# Patient Record
Sex: Male | Born: 1980 | Race: White | Hispanic: No | Marital: Single | Smoking: Current every day smoker
Health system: Southern US, Community
[De-identification: ages and names within clinical notes are randomized; demographics above are authoritative.]

## PROBLEM LIST (undated history)

## (undated) DIAGNOSIS — F988 Other specified behavioral and emotional disorders with onset usually occurring in childhood and adolescence: Secondary | ICD-10-CM

## (undated) DIAGNOSIS — F32A Depression, unspecified: Secondary | ICD-10-CM

## (undated) DIAGNOSIS — F329 Major depressive disorder, single episode, unspecified: Secondary | ICD-10-CM

---

## 2015-11-07 ENCOUNTER — Emergency Department
Admission: EM | Admit: 2015-11-07 | Discharge: 2015-11-07 | Disposition: A | Discharge status: Home / Self Care | Payer: BLUE CROSS/BLUE SHIELD | Source: Home / Self Care | Attending: Family Medicine | Admitting: Family Medicine

## 2015-11-07 ENCOUNTER — Emergency Department (INDEPENDENT_AMBULATORY_CARE_PROVIDER_SITE_OTHER): Payer: BLUE CROSS/BLUE SHIELD

## 2015-11-07 ENCOUNTER — Encounter: Payer: Self-pay | Admitting: *Deleted

## 2015-11-07 DIAGNOSIS — R0981 Nasal congestion: Secondary | ICD-10-CM | POA: Diagnosis not present

## 2015-11-07 DIAGNOSIS — R51 Headache: Secondary | ICD-10-CM | POA: Diagnosis not present

## 2015-11-07 DIAGNOSIS — L03211 Cellulitis of face: Secondary | ICD-10-CM

## 2015-11-07 HISTORY — DX: Major depressive disorder, single episode, unspecified: F32.9

## 2015-11-07 HISTORY — DX: Other specified behavioral and emotional disorders with onset usually occurring in childhood and adolescence: F98.8

## 2015-11-07 HISTORY — DX: Depression, unspecified: F32.A

## 2015-11-07 MED ORDER — DOXYCYCLINE HYCLATE 100 MG PO CAPS: 100.0000 mg | ORAL_CAPSULE | Freq: Two times a day (BID) | ORAL | 0 refills | Status: DC

## 2015-11-07 NOTE — ED Triage Notes (Signed)
Pt c/o 3 weeks of dry cough. Today he noted his forehead swelling, redness and warm to touch.

## 2015-11-07 NOTE — ED Provider Notes (Signed)
[facility]    CSN: [id number] Arrival date & time: 11/07/15  1328     History   Chief Complaint Chief Complaint  Patient presents with  . Cough  . Forehead Swelling    HPI Lemont FillersJason Stuart is a 35 y.o. male.   Patient reports that he has had a non-productive cough for about 3 weeks with fatigue and nasal congestion, all now improved.   Today he noticed that his forehead felt hot with pressure-like discomfort, and appeared flushed.  He continues to have mild nasal congestion.  He has a history of seasonal rhinitis.  No known contact with allergens. He states that his father, mother, paternal aunt, and sister all have sinus problems.   The history is provided by the patient.    Past Medical History:  Diagnosis Date  . ADD (attention deficit disorder)   . Depression     There are no active problems to display for this patient.   History reviewed. No pertinent surgical history.     Home Medications    Prior to Admission medications   Medication Sig Start Date End Date Taking? Authorizing Provider  amphetamine-dextroamphetamine (ADDERALL) 30 MG tablet Take 30 mg by mouth daily.   Yes Historical Provider, MD  clonazePAM (KLONOPIN) 1 MG tablet Take 1 mg by mouth 2 (two) times daily.   Yes Historical Provider, MD  DULoxetine (CYMBALTA) 30 MG capsule Take 30 mg by mouth daily.   Yes Historical Provider, MD  gabapentin (NEURONTIN) 300 MG capsule Take 300 mg by mouth 3 (three) times daily.   Yes Historical Provider, MD  QUEtiapine (SEROQUEL) 300 MG tablet Take 300 mg by mouth at bedtime.   Yes Historical Provider, MD  topiramate (TOPAMAX) 200 MG tablet Take 200 mg by mouth 2 (two) times daily.   Yes Historical Provider, MD  zolpidem (AMBIEN) 10 MG tablet Take 10 mg by mouth at bedtime as needed for sleep.   Yes Historical Provider, MD  doxycycline (VIBRAMYCIN) 100 MG capsule Take 1 capsule (100 mg total) by mouth 2 (two) times daily. Take with food. 11/07/15    Lattie HawStephen A Ogden Handlin, MD    Family History Family History  Problem Relation Age of Onset  . Hypertension Father     Social History Social History  Substance Use Topics  . Smoking status: Current Every Day Smoker    Types: E-cigarettes  . Smokeless tobacco: Never Used  . Alcohol use Yes     Allergies   Sulfur   Review of Systems Review of Systems  No sore throat + cough, improving No pleuritic pain No wheezing + nasal congestion + post-nasal drainage No sinus pain/pressure No itchy/red eyes No earache No hemoptysis No SOB No fever, but feels hot No nausea No vomiting No abdominal pain No diarrhea No urinary symptoms + skin rash face + fatigue No myalgias + headache Used OTC meds without relief    Physical Exam Triage Vital Signs ED Triage Vitals  Enc Vitals Group     BP 11/07/15 1401 139/91     Pulse Rate 11/07/15 1401 77     Resp --      Temp 11/07/15 1401 98.3 F (36.8 C)     Temp Source 11/07/15 1401 Oral     SpO2 11/07/15 1401 98 %     Weight 11/07/15 1401 171 lb (77.6 kg)     Height --      Head Circumference --      Peak Flow --  Pain Score 11/07/15 1405 0     Pain Loc --      Pain Edu? --      Excl. in GC? --    No data found.   Updated Vital Signs BP 139/91 (BP Location: Left Arm)   Pulse 77   Temp 98.3 F (36.8 C) (Oral)   Wt 171 lb (77.6 kg)   SpO2 98%   Visual Acuity Right Eye Distance:   Left Eye Distance:   Bilateral Distance:    Right Eye Near:   Left Eye Near:    Bilateral Near:     Physical Exam  Constitutional: He appears well-developed and well-nourished. No distress.  HENT:  Head: Normocephalic.    Right Ear: Tympanic membrane, external ear and ear canal normal.  Left Ear: Tympanic membrane, external ear and ear canal normal.  Nose: Nose normal.  Mouth/Throat: Oropharynx is clear and moist.  Forehead is slightly erythematous and warm, without tenderness to palpation, induration, or fluctuance.  Eyes:  Conjunctivae and EOM are normal. Pupils are equal, round, and reactive to light.  Neck: Neck supple.  Posterior/lateral cervical nodes are tender and slightly enlarged.  Cardiovascular: Normal heart sounds.   Pulmonary/Chest: Breath sounds normal.  Abdominal: Bowel sounds are normal. There is no tenderness.  Musculoskeletal: He exhibits no edema.  Neurological: He is alert.  Skin: Skin is warm. There is erythema.  Nursing note and vitals reviewed.    UC Treatments / Results  Labs (all labs ordered are listed, but only abnormal results are displayed) Labs Reviewed - No data to display  EKG  EKG Interpretation None       Radiology Dg Sinuses Complete  Result Date: 11/07/2015 CLINICAL DATA:  Frontal sinus pain, facial pain EXAM: PARANASAL SINUSES - COMPLETE 3 + VIEW COMPARISON:  None. FINDINGS: The paranasal sinus are aerated. There is no evidence of sinus opacification air-fluid levels or mucosal thickening. No significant bone abnormalities are seen. IMPRESSION: Negative. Electronically Signed   By: [name] M.D.   On: 11/07/2015 14:44    Procedures Procedures (including critical care time)  Medications Ordered in UC Medications - No data to display   Initial Impression / Assessment and Plan / UC Course  I have reviewed the triage vital signs and the nursing notes.  Pertinent labs & imaging results that were available during my care of the patient were reviewed by me and considered in my medical decision making (see chart for details).  Clinical Course  No evidence sinusitis. ?cellulitis forehead, ?etiology Begin empiric doxycycline 100mg  BID for staph coverage.   Followup with dermatologist if not improved one week.     Final Clinical Impressions(s) / UC Diagnoses   Final diagnoses:  Cellulitis of forehead    New Prescriptions New Prescriptions   DOXYCYCLINE (VIBRAMYCIN) 100 MG CAPSULE    Take 1 capsule (100 mg total) by mouth 2 (two) times daily. Take  with food.     Lattie Haw, MD 11/13/15 [phone]

## 2016-01-12 ENCOUNTER — Encounter: Payer: Self-pay | Admitting: *Deleted

## 2016-01-12 ENCOUNTER — Emergency Department (INDEPENDENT_AMBULATORY_CARE_PROVIDER_SITE_OTHER)
Admission: EM | Admit: 2016-01-12 | Discharge: 2016-01-12 | Disposition: A | Discharge status: Home / Self Care | Payer: BLUE CROSS/BLUE SHIELD | Source: Home / Self Care | Attending: Family Medicine | Admitting: Family Medicine

## 2016-01-12 DIAGNOSIS — S29012A Strain of muscle and tendon of back wall of thorax, initial encounter: Secondary | ICD-10-CM

## 2016-01-12 MED ORDER — METAXALONE 800 MG PO TABS: ORAL_TABLET | ORAL | 0 refills | Status: AC

## 2016-01-12 NOTE — Discharge Instructions (Addendum)
Apply ice pack for 20 to 30 minutes, 3 to 4 times daily  Continue until pain and swelling decrease.  May take Ibuprofen 200mg, 4 tabs every 8 hours with food.  Begin range of motion and stretching exercises as tolerated. °
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
## 2017-04-20 IMAGING — DX DG SINUSES COMPLETE 3+V
3 series · 3 of 3 positions shown · non-contrast
Comparison: None.

CLINICAL DATA: Frontal sinus pain, facial pain

EXAM:
PARANASAL SINUSES - COMPLETE 3 + VIEW

[pns waters]
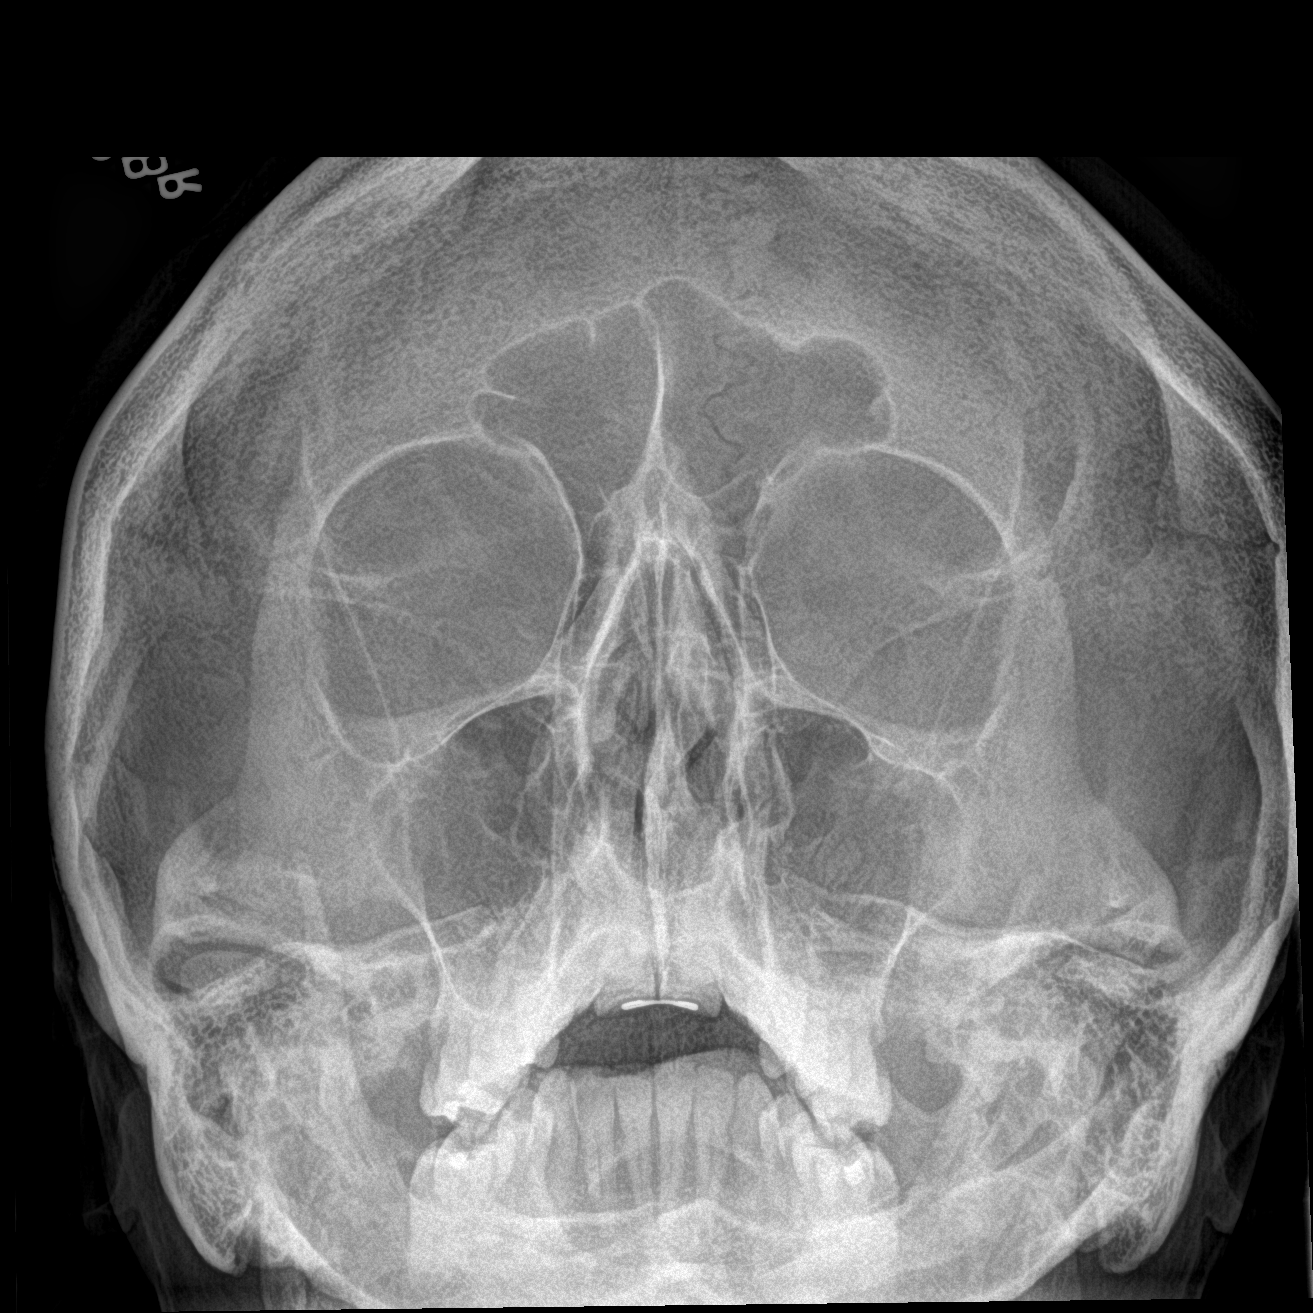

[[person_name]]
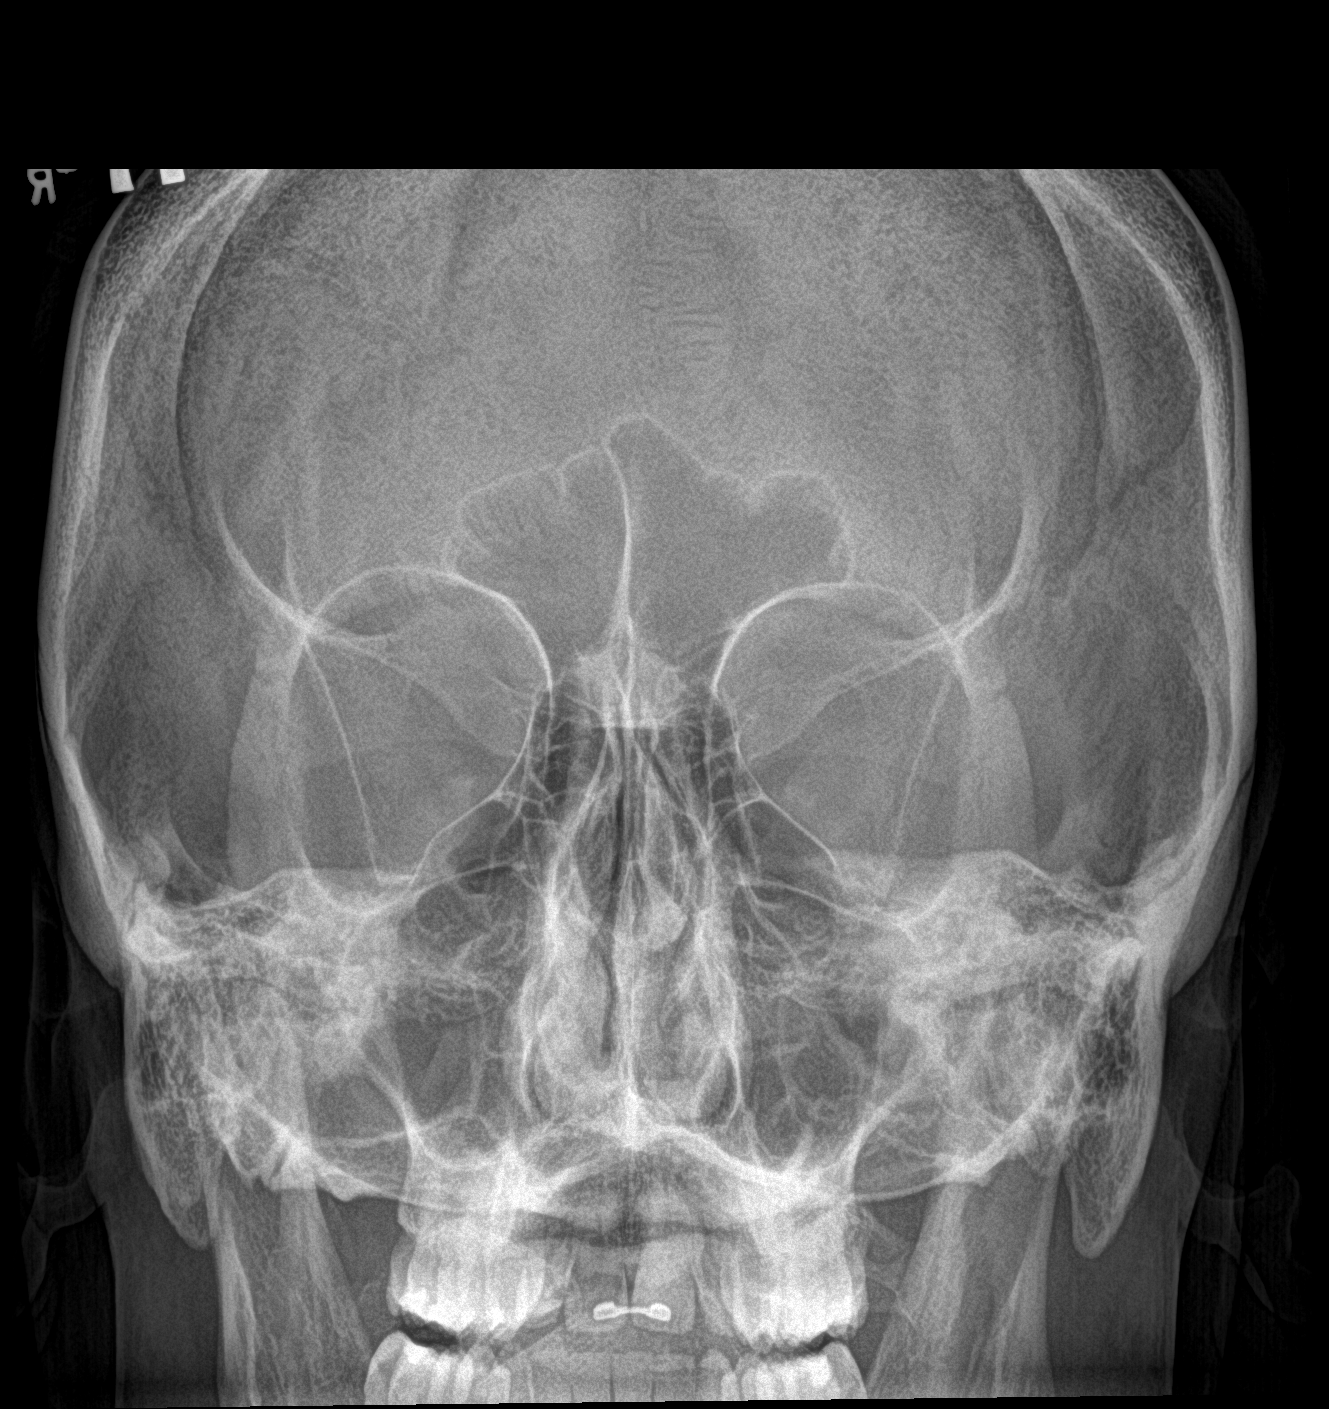

[pns lat]
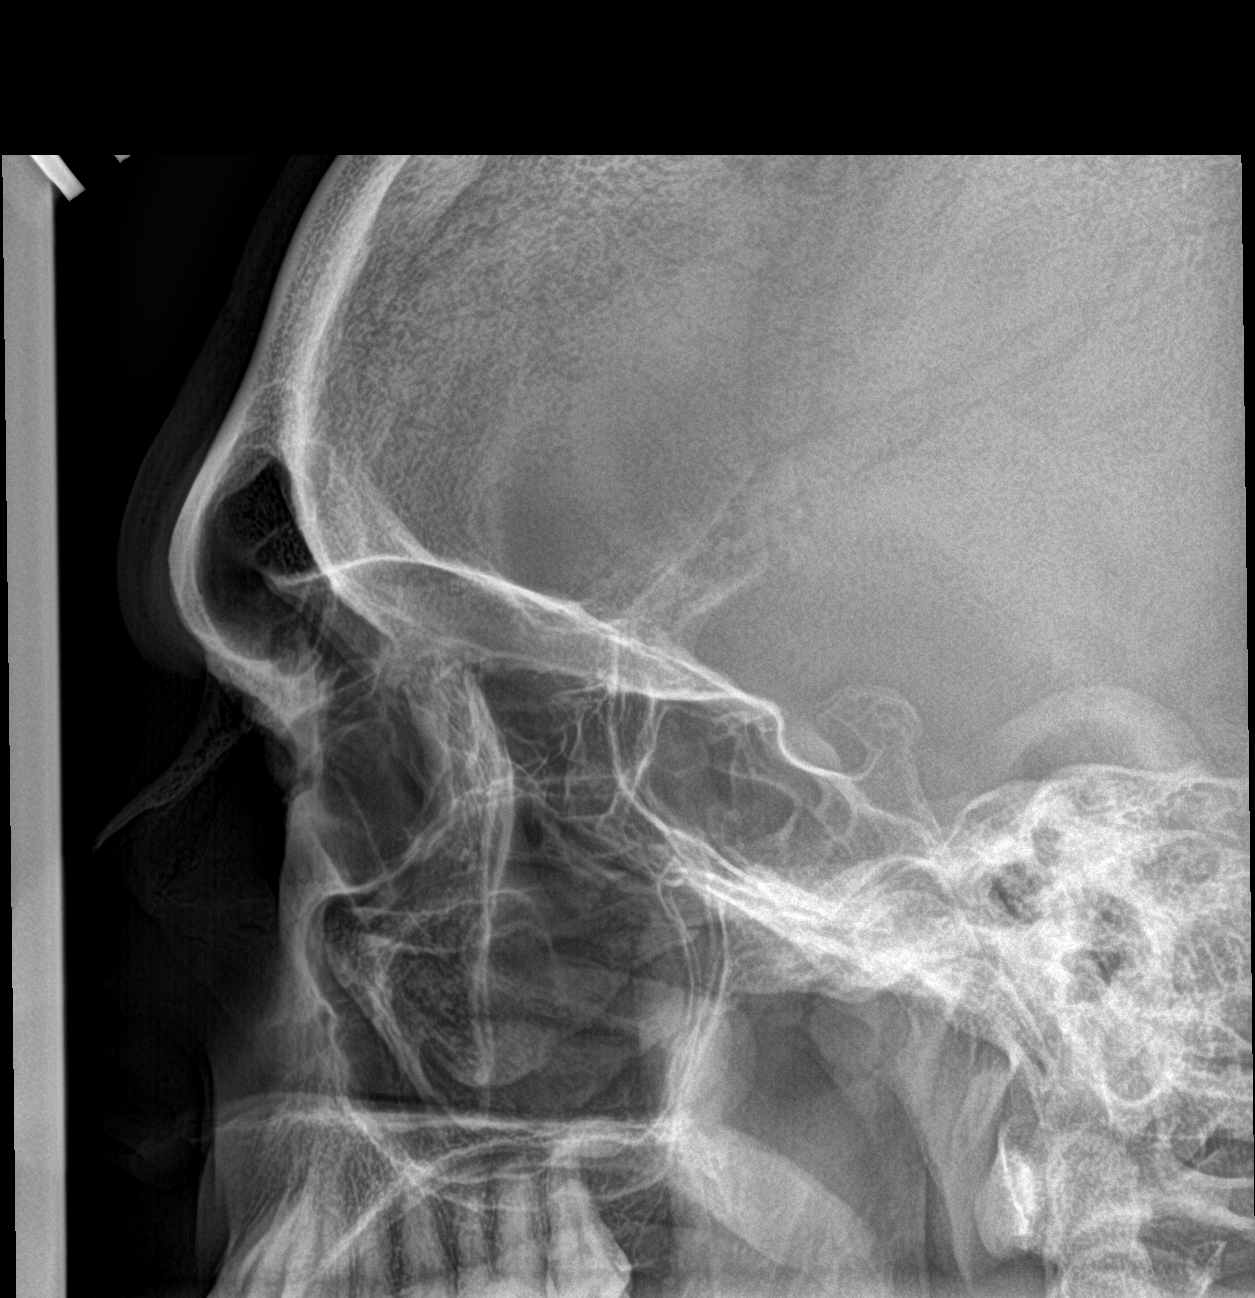

[3 of 3 positions shown; findings below may reference images not displayed]

FINDINGS: The paranasal sinus are aerated. There is no evidence of sinus
opacification air-fluid levels or mucosal thickening. No significant
bone abnormalities are seen.
IMPRESSION: Negative.
